# Patient Record
Sex: Female | Born: 2000 | Race: Black or African American | Hispanic: No | Marital: Single | Smoking: Never smoker
Health system: Southern US, Community
[De-identification: ages and names within clinical notes are randomized; demographics above are authoritative.]

## PROBLEM LIST (undated history)

## (undated) HISTORY — PX: TONSILLECTOMY: SUR1361

---
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
## 2019-06-21 ENCOUNTER — Ambulatory Visit: Payer: Managed Care, Other (non HMO) | Attending: Family

## 2019-06-21 DIAGNOSIS — Z23 Encounter for immunization: Secondary | ICD-10-CM

## 2019-06-21 NOTE — Progress Notes (Signed)
   Covid-19 Vaccination Clinic  Name:  Casey Vincent    MRN: [id number] DOB: [birth year]  06/21/2019  Casey Vincent was observed post Covid-19 immunization for 15 minutes without incident. She was provided with Vaccine Information Sheet and instruction to access the V-Safe system.   Casey Vincent was instructed to call 911 with any severe reactions post vaccine: Marland Kitchen Difficulty breathing  . Swelling of face and throat  . A fast heartbeat  . A bad rash all over body  . Dizziness and weakness   Immunizations Administered    Name Date Dose VIS Date Route   Moderna COVID-19 Vaccine 06/21/2019 10:24 AM 0.5 mL 02/27/2019 Intramuscular   Manufacturer: Moderna   Lot: [id number]   NDC: 10254-862-82

## 2019-07-24 ENCOUNTER — Ambulatory Visit: Payer: Managed Care, Other (non HMO) | Attending: Family

## 2019-07-24 DIAGNOSIS — Z23 Encounter for immunization: Secondary | ICD-10-CM

## 2019-07-24 NOTE — Progress Notes (Signed)
   Covid-19 Vaccination Clinic  Name:  Casey Vincent    MRN: [id number] DOB: 07-20-2000  07/24/2019  [name] was observed post Covid-19 immunization for 15 minutes without incident. She was provided with Vaccine Information Sheet and instruction to access the V-Safe system.   [name] was instructed to call 911 with any severe reactions post vaccine: Marland Kitchen Difficulty breathing  . Swelling of face and throat  . A fast heartbeat  . A bad rash all over body  . Dizziness and weakness   Immunizations Administered    Name Date Dose VIS Date Route   Moderna COVID-19 Vaccine 07/24/2019  1:46 PM 0.5 mL 02/2019 Intramuscular   Manufacturer: Moderna   Lot: [id number]   NDC: 21798-102-54

## 2020-07-29 ENCOUNTER — Other Ambulatory Visit: Payer: Self-pay | Admitting: Otolaryngology

## 2020-07-29 DIAGNOSIS — J36 Peritonsillar abscess: Secondary | ICD-10-CM

## 2020-08-21 ENCOUNTER — Ambulatory Visit
Admission: RE | Admit: 2020-08-21 | Discharge: 2020-08-21 | Disposition: A | Discharge status: Home / Self Care | Payer: Managed Care, Other (non HMO) | Source: Ambulatory Visit | Attending: Otolaryngology | Admitting: Otolaryngology

## 2020-08-21 ENCOUNTER — Other Ambulatory Visit: Payer: Self-pay

## 2020-08-21 DIAGNOSIS — J36 Peritonsillar abscess: Secondary | ICD-10-CM

## 2020-08-21 MED ORDER — IOPAMIDOL (ISOVUE-370) INJECTION 76%
75.0000 mL | Freq: Once | INTRAVENOUS | Status: AC | PRN
  Administered 2020-08-21: 75 mL via INTRAVENOUS

## 2022-10-21 IMAGING — CT CT NECK W/ CM
2 of 3 series · 8 of 14 positions shown, 9 images · IV contrast (iopamidol)
Comparison: 6060

CLINICAL DATA: Tonsil abscess

EXAM:
CT NECK WITH CONTRAST
TECHNIQUE: Multidetector CT imaging of the neck was performed using the
standard protocol following the bolus administration of intravenous
contrast.
CONTRAST:  75mL [id number]-46D IOPAMIDOL ([id number]-46D) INJECTION 76%

[Series 3: neck · axial · 0.41mm/px · z∈[-196,-40]mm · 4 of 131 slices shown]
[im 27/131  bone]
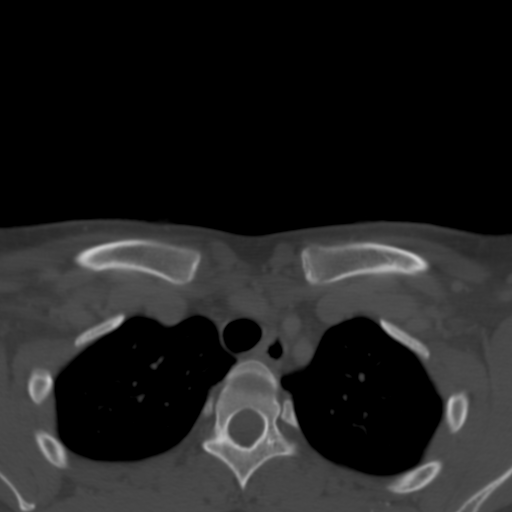
[im 53/131  bone]
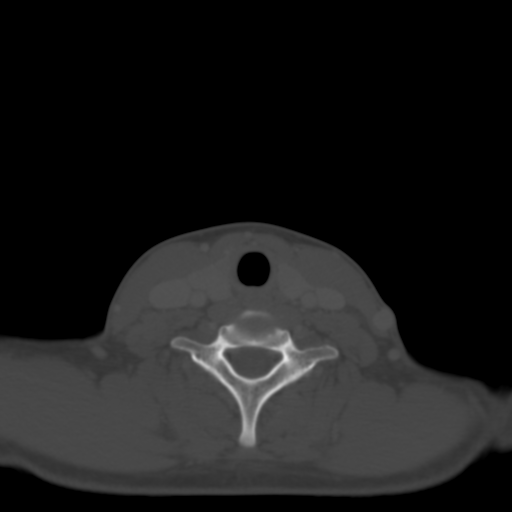
[im 79/131  bone]
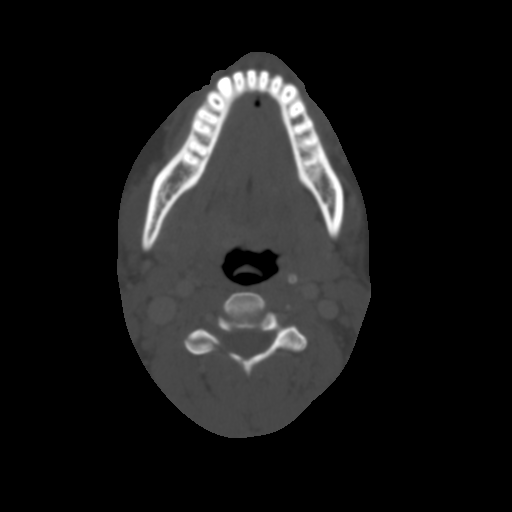
[im 105/131  bone]
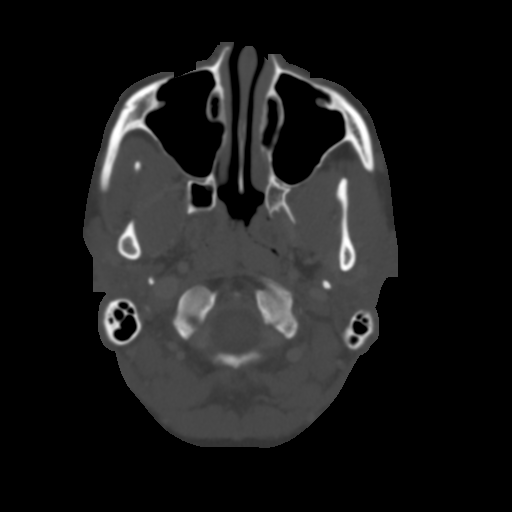

[Series 8: angled axial-oropharynx · axial · 0.39mm/px · z∈[-212,-47]mm · 4 of 140 slices shown, 5 images]
[im 28/140  soft-tissue]
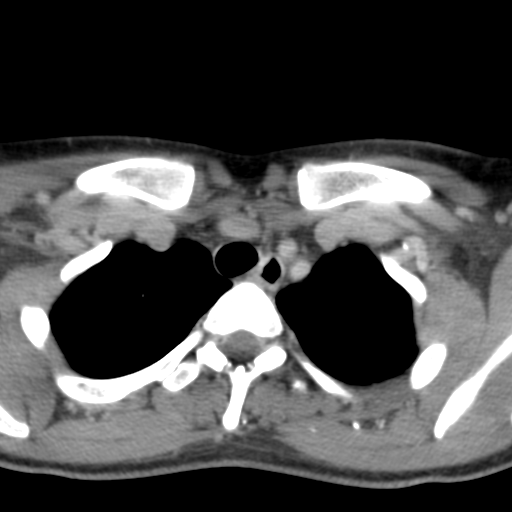
[im 28/140  bone]
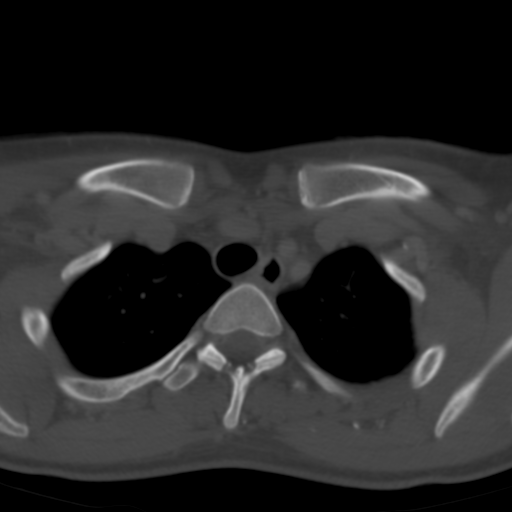
[im 56/140  bone]
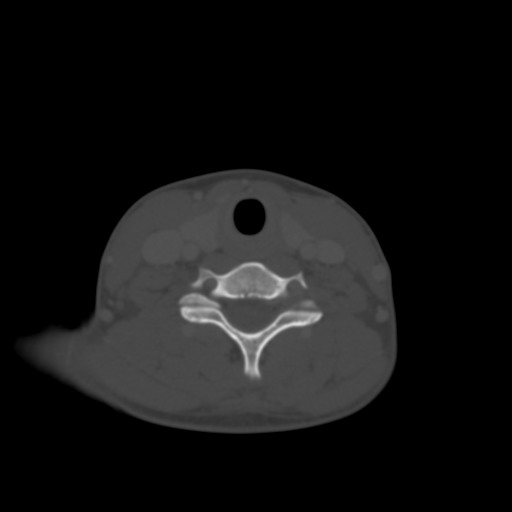
[im 84/140  bone]
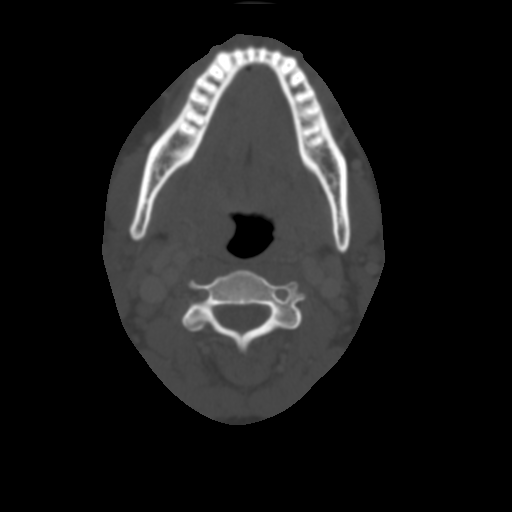
[im 112/140  bone]
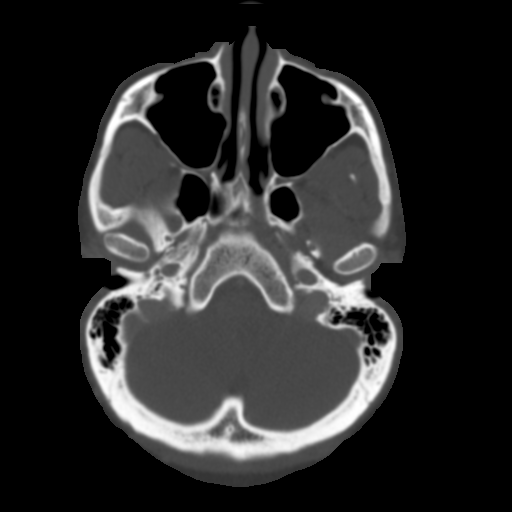

[8 of 14 positions shown; findings below may reference images not displayed]

FINDINGS: Pharynx and larynx: Mild prominence of the palatine and lingual
tonsils. No heterogeneous enhancement or evidence of peritonsillar
abscess. Parapharyngeal fat is preserved. Remainder of pharynx and
larynx unremarkable. Airway is patent.

Salivary glands: Parotid and submandibular glands are unremarkable.

Thyroid: Normal.

Lymph nodes: Top-normal right level 2 node. No enlarged or abnormal
density nodes.

Vascular: Major neck vessels are patent.

Limited intracranial: No abnormal enhancement.

Visualized orbits: Unremarkable.

Mastoids and visualized paranasal sinuses: Aerated.

Skeleton: Unremarkable.

Upper chest: Included upper lungs are clear.

Other: None.
IMPRESSION: No evidence of peritonsillar abscess or significant inflammatory
changes.
